# Patient Record
Sex: Female | Born: 2000
Health system: Southern US, Community
[De-identification: ages and names within clinical notes are randomized; demographics above are authoritative.]

## PROBLEM LIST (undated history)

## (undated) DIAGNOSIS — E781 Pure hyperglyceridemia: Secondary | ICD-10-CM

## (undated) DIAGNOSIS — J45909 Unspecified asthma, uncomplicated: Secondary | ICD-10-CM

## (undated) DIAGNOSIS — F419 Anxiety disorder, unspecified: Secondary | ICD-10-CM

## (undated) DIAGNOSIS — E669 Obesity, unspecified: Secondary | ICD-10-CM

## (undated) DIAGNOSIS — E282 Polycystic ovarian syndrome: Secondary | ICD-10-CM

## (undated) DIAGNOSIS — Z91018 Allergy to other foods: Secondary | ICD-10-CM

## (undated) DIAGNOSIS — F32A Depression, unspecified: Secondary | ICD-10-CM

## (undated) DIAGNOSIS — T7840XA Allergy, unspecified, initial encounter: Secondary | ICD-10-CM

## (undated) DIAGNOSIS — R51 Headache: Secondary | ICD-10-CM

## (undated) DIAGNOSIS — F0781 Postconcussional syndrome: Secondary | ICD-10-CM

## (undated) DIAGNOSIS — L732 Hidradenitis suppurativa: Secondary | ICD-10-CM

## (undated) HISTORY — DX: Unspecified asthma, uncomplicated: J45.909

## (undated) HISTORY — PX: TONSILLECTOMY AND ADENOIDECTOMY: SUR1326

## (undated) HISTORY — DX: Obesity, unspecified: E66.9

## (undated) HISTORY — DX: Headache: R51

## (undated) HISTORY — DX: Anxiety disorder, unspecified: F41.9

## (undated) HISTORY — DX: Polycystic ovarian syndrome: E28.2

## (undated) HISTORY — DX: Allergy to other foods: Z91.018

## (undated) HISTORY — DX: Pure hyperglyceridemia: E78.1

## (undated) HISTORY — DX: Depression, unspecified: F32.A

## (undated) HISTORY — DX: Allergy, unspecified, initial encounter: T78.40XA

---
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
## 2021-02-18 ENCOUNTER — Ambulatory Visit (INDEPENDENT_AMBULATORY_CARE_PROVIDER_SITE_OTHER): Payer: No Typology Code available for payment source | Admitting: Pediatric Endocrinology

## 2021-02-19 ENCOUNTER — Encounter (INDEPENDENT_AMBULATORY_CARE_PROVIDER_SITE_OTHER): Payer: Self-pay | Admitting: Pediatric Endocrinology

## 2021-02-19 ENCOUNTER — Other Ambulatory Visit: Payer: Self-pay

## 2021-02-19 ENCOUNTER — Ambulatory Visit (INDEPENDENT_AMBULATORY_CARE_PROVIDER_SITE_OTHER): Payer: No Typology Code available for payment source | Admitting: Pediatric Endocrinology

## 2021-02-19 VITALS — BP 132/88 | HR 72 | Wt 375.2 lb

## 2021-02-19 DIAGNOSIS — F5081 Binge eating disorder: Secondary | ICD-10-CM

## 2021-02-19 DIAGNOSIS — E119 Type 2 diabetes mellitus without complications: Secondary | ICD-10-CM | POA: Diagnosis not present

## 2021-02-19 DIAGNOSIS — R69 Illness, unspecified: Secondary | ICD-10-CM | POA: Diagnosis not present

## 2021-02-19 LAB — POCT GLUCOSE (DEVICE FOR HOME USE): POC Glucose: 113 mg/dl — AB (ref 70–99)

## 2021-02-19 MED ORDER — METFORMIN HCL ER 500 MG PO TB24: 1000.0000 mg | ORAL_TABLET | Freq: Two times a day (BID) | ORAL | 3 refills | Status: DC

## 2021-02-19 NOTE — Progress Notes (Signed)
Subjective:  Subjective  Patient Name: Catherine Chen Date of Birth: [birth year]  MRN: [id number]  Catherine Chen  presents to the office today for follow-up evaluation and management of her  insulin resistance, elevated fasting insulin, hypertriglyceridemia, and morbid obesity   HISTORY OF PRESENT ILLNESS:   [name] is a 21 y.o. AA female   [name] was seen alone today  1. [name] was referred to our clinic in July 2013 for the above concerns. She was born at term following a healthy pregnancy. Mom reports that she has always been large for age. They first became concerned about her weight when she was 21 yo. At that time she was not very active. They realized that she was tired because she was not sleeping well. She had her tonsils and adenoids removed at age 51. After that she became more active and mom was less concerned. Her concerns were increased again about 2 years ago when routine lab work revealed a triglyceride level over 400. They began to make some changes to diet and activity at home. Mom continues to be concerned about [name]'s weight. She worries that [name] weighs more than mom and borrows her clothes.     2. The patient's last [facility] visit was on 11/15/20. In the interim she has been doing ok.   She feels that she is stable "for the most part". She is extremely tired by her grueling schedule of work and school. 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 She is currently taking a break from working. She is living with her aunt who has returned from [place].   She is not currently taking metformin. She is "usually" eating more than once a day. She tried the low allergen protein bars that we found online last visit- but she didn't like them.   She is drinking water, some soda (diet or regular). She is no longer drinking alcohol on a regular basis.   She is going to the gym 3-4 times  a week. She stays for 2 hours and works out "to the point of exhaustion" She does mostly cardio and some weight lifting. She says that her therapist has expressed concerns that she is working out to "punish" herself and it is actually harmful. She is working to her max multiple times over while she is there.   She reports that she does not necessarily feel guilty about eating- but she eats when she feels guilty and then feels bad about how much she ate and then she goes to the gym to "repent". She is not interested in a referral to a dietician. If she were I would recommend Mirian Capuchin at [facility]. She has recently found a new counselor.   She has continued on Sprintec. She feels that it is working well for her.    3. Pertinent Review of Systems:  Constitutional: The patient feels "tired". The patient seems healthy and active. She worked last night.  Eyes: Vision seems to be good. There are no recognized eye problems. Supposed to wear glasses for reading but does not. She is seeing floaters- only when she doesn't eat - occassionally  Neck: The patient has no complaints of anterior neck swelling, soreness, tenderness, pressure, discomfort, or difficulty swallowing.   Heart: Heart rate increases with exercise or other physical activity. The patient has no complaints of palpitations, irregular heart beats, chest pain, or chest pressure.   Gastrointestinal: Bowel movents  seem normal. The patient has no complaints of excessive hunger, acid reflux, upset stomach, stomach aches or pains, diarrhea, or constipation. Some belly hunger.  Legs: Muscle mass and strength seem normal. There are no complaints of numbness, tingling, burning, or pain. No edema is noted.  Feet: There are no obvious foot problems. There are no complaints of numbness, tingling, burning, or pain. No edema is noted. Neurologic: There are no recognized problems with muscle movement and strength, sensation, or coordination. GYN/GU:  regular menses on Sprintec. 01/26/21  Skin: No rashes or issues  PAST MEDICAL, FAMILY, AND SOCIAL HISTORY  Past Medical History:  Diagnosis Date   Allergy    Phreesia 09/13/2019   Asthma    Headache(784.0)    Hydradenitis 04/22/2016   Hypertriglyceridemia    Multiple food allergies    shell fish, nuts, watermellon   Obesity    Postconcussion syndrome 02/19/2016    Family History  Problem Relation Age of Onset   Thyroid disease Father    Eczema Father    Cancer Maternal Grandmother    Hypertension Maternal Grandmother    Hyperlipidemia Maternal Grandmother    Hypertension Maternal Grandfather    Hyperlipidemia Maternal Grandfather    Cancer Paternal Grandmother    Hypertension Paternal Grandmother    Hyperlipidemia Paternal Grandmother    Asthma Paternal Grandmother    Food Allergy Paternal Grandmother    Eczema Paternal Grandmother    Obesity Mother    Obesity Sister    Hypertension Paternal Grandfather    Hyperlipidemia Paternal Grandfather    Heart disease Paternal Grandfather    Down syndrome Paternal Aunt      Current Outpatient Medications:    albuterol (PROVENTIL HFA;VENTOLIN HFA) 108 (90 Base) MCG/ACT inhaler, Inhale 2 puffs into the lungs every 4 (four) hours as needed for wheezing or shortness of breath., Disp: 1 Inhaler, Rfl: 1   cetirizine (ZYRTEC) 10 MG tablet, Take 10 mg by mouth daily., Disp: , Rfl:    SPRINTEC 28 0.25-35 MG-MCG tablet, TAKE AS DIRECTED 2 TABLETS BY MOUTH FOR 5 DAYS THEN TAKE 1 TABLET BY MOUTH ONCE DAILY, Disp: , Rfl:    diphenhydrAMINE (BENADRYL) 25 MG tablet, Take 25 mg by mouth every 6 (six) hours as needed. Taking prn (Patient not taking: Reported on 11/15/2020), Disp: , Rfl:    metFORMIN (GLUCOPHAGE-XR) 500 MG 24 hr tablet, Take 2 tablets (1,000 mg total) by mouth 2 (two) times daily with a meal., Disp: 360 tablet, Rfl: 3  Allergies as of 02/19/2021 - Review Complete 02/19/2021  Allergen Reaction Noted   Food Hives 09/08/2011    Other Anaphylaxis, Hives, Itching, Shortness Of Breath, and Swelling 09/13/2019   Peanut-containing drug products Anaphylaxis, Hives, Itching, Rash, Shortness Of Breath, and Swelling 09/13/2019   Peanuts [peanut oil] Hives 09/08/2011   Penicillins Hives 08/11/2011   Shellfish allergy Hives 09/08/2011   Coconut oil Hives 09/08/2011     reports that she has never smoked. She has been exposed to tobacco smoke. She has never used smokeless tobacco. She reports that she does not drink alcohol and does not use drugs. 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 On path to graduate Dec 2023   She is not living in dorms, she is living in an apartment.  She plays ukulele when she can.  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 Wants to do a Masters in Public affairs consultantDesign Engineering at [employer]. Planning to graduate in December.   Primary Care Provider: Pcp, No  ROS: There are no other significant problems involving Cleo's other body systems.    Objective:  Objective  Vital Signs:     BP 132/88 (BP Location: Right Arm, Patient Position: Sitting, Cuff Size: Large)    Pulse 72    Wt (!) 375 lb 3.2 oz (170.2 kg)    LMP 01/26/2021 (Approximate)    BMI 55.89 kg/m  Growth percentile SmartLinks can only be used for patients less than 20 years old.   Ht Readings from Last 3 Encounters:  05/09/20 5' 8.7" (1.745 m) (96 %, Z= 1.74)*  09/12/19 5' 8.7" (1.745 m) (96 %, Z= 1.75)*  04/21/19 5' 8.74" (1.746 m) (96 %, Z= 1.77)*   * Growth percentiles are based on CDC (Girls, 2-20 Years) data.   Wt Readings from Last 3 Encounters:  02/19/21 (!) 375 lb 3.2 oz (170.2 kg)  11/15/20 (!) 361 lb 6.4 oz (163.9 kg) (>99 %, Z= 3.11)*  08/13/20 (!) 364 lb 3.2 oz (165.2 kg) (>99 %, Z= 3.09)*   * Growth percentiles are based on CDC (Girls, 2-20  Years) data.   HC Readings from Last 3 Encounters:  No data found for [facility]   Body surface area is 2.87 meters squared. Facility age limit for growth percentiles is 20 years. Facility age limit for growth percentiles is 20 years.  PHYSICAL EXAM:   Constitutional: The patient appears healthy and well nourished. The patient's height and weight are advanced for age. Weight has increased 14 pounds. Head: The head is normocephalic. Face: The face appears normal. There are no obvious dysmorphic features. Eyes: The eyes appear to be normally formed and spaced. Gaze is conjugate. There is no obvious arcus or proptosis. Moisture appears normal. Ears: The ears are normally placed and appear externally normal. Mouth: The oropharynx and tongue appear normal. Dentition appears to be normal for age. Oral moisture is normal. Neck: The neck appears to be visibly normal. The thyroid gland is 15 grams in size. The consistency of the thyroid gland is normal. The thyroid gland is not tender to palpation. +2 acanthosis with thickening Lungs: The lungs are clear to auscultation. Air movement is good. Heart: Heart rate and rhythm are regular. Heart sounds S1 and S2 are normal. I did not appreciate any pathologic cardiac murmurs. Abdomen: The abdomen appears to be large in size for the patient's age. Bowel sounds are normal. There is no obvious hepatomegaly, splenomegaly, or other mass effect.  Arms: Muscle size and bulk are normal for age.  Hands: There is no obvious tremor. Phalangeal and metacarpophalangeal joints are normal. Palmar muscles are normal for age. Palmar skin is normal. Palmar moisture is also normal. Legs: Muscles appear normal for age. No edema is present. Feet: Feet are normally formed. Dorsalis pedal pulses are normal. Neurologic: Strength is normal for age in both the upper and lower extremities. Muscle tone is normal. Sensation to touch is normal in both the legs and feet.    LAB DATA:       Lab Results  Component Value Date   HGBA1C 5.1 11/15/2020   HGBA1C 5.5 08/13/2020   HGBA1C 5.1 05/09/2020   HGBA1C 5.6 01/02/2020   HGBA1C 5.3 09/12/2019   HGBA1C 5.3 04/21/2019   HGBA1C 5.1 11/29/2018   HGBA1C 7.9 (A) 08/18/2018  Results for orders placed or performed in visit on 02/19/21 (from the past 672 hour(s))  POCT Glucose (Device for Home Use)   Collection Time: 02/19/21  9:18 AM  Result Value Ref Range   Glucose Fasting, POC     POC Glucose 113 (A) 70 - 99 mg/dl       Assessment and Plan:  Assessment  ASSESSMENT: Necie is a 21 y.o. AA female with morbid obesity, insulin resistance, and vit d insufficieny. She has previously had hyperlipidemia with elevated triglycerides- but recent values have been WNL.  Now with concerns for type 2 diabetes with A1C 7.9% in August 2020.    1. Type 2 diabetes -A1C not checked today - She has struggled with taking her Metformin as she has had GI upset with the higher doses - insurance would not approve oral GLP-1 and she has needle phobia - She declined to do a trial injection of Trulicity in clinic today.   2. Disordered eating/exercise - Concerns about dysfunctional relationship with food and exercise - Is seeing a new therapist who is working on this - She is resistant to a referral to nutritionist today Mirian Capuchin)   3. Weight-  - she is morbidly obese - weight has increased since last visit  - Discussion as above  4. Acanthosis- persistent - improving. Related to diabetes/insulin resistance   PLAN:    1. Diagnostic: A1C at next visit. 2. Therapeutic: Restart Metformin as tolerated.  3. Patient education. Discussions as above. Now seeing a new therapist. 4. Follow-up: Return in about 3 months (around 05/19/2021).      >40 minutes spent today reviewing the medical chart, counseling the patient/family, and documenting today's encounter.    Dessa Phi, MD

## 2021-02-19 NOTE — Patient Instructions (Addendum)
°  [email or web address]  [email or web address] Mirian Capuchin    Work on [employer] and limits for when you are at the gym. There is no advantage to working out to the point of exhaustion. Set reasonable goals where you are comfortable and then increase SLOWLY from there. Consistency and repetition are more important than maxing out.

## 2021-04-23 ENCOUNTER — Other Ambulatory Visit: Payer: Self-pay

## 2021-04-23 ENCOUNTER — Encounter (HOSPITAL_BASED_OUTPATIENT_CLINIC_OR_DEPARTMENT_OTHER): Payer: Self-pay

## 2021-04-23 DIAGNOSIS — H53149 Visual discomfort, unspecified: Secondary | ICD-10-CM | POA: Insufficient documentation

## 2021-04-23 DIAGNOSIS — R519 Headache, unspecified: Secondary | ICD-10-CM | POA: Diagnosis not present

## 2021-04-23 DIAGNOSIS — Z91010 Allergy to peanuts: Secondary | ICD-10-CM | POA: Diagnosis not present

## 2021-04-23 DIAGNOSIS — Z7722 Contact with and (suspected) exposure to environmental tobacco smoke (acute) (chronic): Secondary | ICD-10-CM | POA: Insufficient documentation

## 2021-04-23 DIAGNOSIS — J45909 Unspecified asthma, uncomplicated: Secondary | ICD-10-CM | POA: Insufficient documentation
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
## 2021-04-24 ENCOUNTER — Emergency Department (HOSPITAL_BASED_OUTPATIENT_CLINIC_OR_DEPARTMENT_OTHER)
Admission: EM | Admit: 2021-04-24 | Discharge: 2021-04-24 | Disposition: A | Discharge status: Home / Self Care | Payer: No Typology Code available for payment source | Attending: Emergency Medicine | Admitting: Emergency Medicine

## 2021-04-24 DIAGNOSIS — G44319 Acute post-traumatic headache, not intractable: Secondary | ICD-10-CM | POA: Diagnosis not present

## 2021-04-24 DIAGNOSIS — R519 Headache, unspecified: Secondary | ICD-10-CM

## 2021-04-24 LAB — PREGNANCY, URINE: Preg Test, Ur: NEGATIVE

## 2021-04-24 MED ORDER — ACETAMINOPHEN 500 MG PO TABS
1000.0000 mg | ORAL_TABLET | Freq: Once | ORAL | Status: AC
  Administered 2021-04-24: 1000 mg via ORAL
  Filled 2021-04-24: qty 2

## 2021-04-24 MED ORDER — SODIUM CHLORIDE 0.9 % IV BOLUS: 1000.0000 mL | Freq: Once | INTRAVENOUS | Status: DC

## 2021-04-24 MED ORDER — KETOROLAC TROMETHAMINE 30 MG/ML IJ SOLN
30.0000 mg | Freq: Once | INTRAMUSCULAR | Status: AC
  Administered 2021-04-24: 30 mg via INTRAMUSCULAR
  Filled 2021-04-24: qty 1

## 2021-04-24 MED ORDER — METOCLOPRAMIDE HCL 5 MG/ML IJ SOLN
10.0000 mg | Freq: Once | INTRAMUSCULAR | Status: DC
  Filled 2021-04-24: qty 2

## 2021-04-24 MED ORDER — KETOROLAC TROMETHAMINE 15 MG/ML IJ SOLN
15.0000 mg | Freq: Once | INTRAMUSCULAR | Status: DC | PRN
  Filled 2021-04-24: qty 1

## 2021-04-24 MED ORDER — DIPHENHYDRAMINE HCL 50 MG/ML IJ SOLN
25.0000 mg | Freq: Once | INTRAMUSCULAR | Status: DC
  Filled 2021-04-24: qty 1
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
## 2021-04-25 DIAGNOSIS — G44309 Post-traumatic headache, unspecified, not intractable: Secondary | ICD-10-CM | POA: Diagnosis not present

## 2021-05-11 DIAGNOSIS — M25512 Pain in left shoulder: Secondary | ICD-10-CM | POA: Diagnosis not present

## 2021-05-27 ENCOUNTER — Ambulatory Visit (INDEPENDENT_AMBULATORY_CARE_PROVIDER_SITE_OTHER): Payer: No Typology Code available for payment source | Admitting: Pediatric Endocrinology

## 2021-06-03 DIAGNOSIS — F411 Generalized anxiety disorder: Secondary | ICD-10-CM | POA: Diagnosis not present

## 2021-06-03 DIAGNOSIS — F39 Unspecified mood [affective] disorder: Secondary | ICD-10-CM | POA: Diagnosis not present

## 2021-06-03 DIAGNOSIS — R69 Illness, unspecified: Secondary | ICD-10-CM | POA: Diagnosis not present

## 2021-06-28 DIAGNOSIS — F411 Generalized anxiety disorder: Secondary | ICD-10-CM | POA: Diagnosis not present

## 2021-06-28 DIAGNOSIS — R69 Illness, unspecified: Secondary | ICD-10-CM | POA: Diagnosis not present

## 2021-06-28 DIAGNOSIS — F39 Unspecified mood [affective] disorder: Secondary | ICD-10-CM | POA: Diagnosis not present

## 2021-07-25 ENCOUNTER — Encounter (HOSPITAL_COMMUNITY): Payer: Self-pay

## 2021-07-25 ENCOUNTER — Emergency Department (HOSPITAL_COMMUNITY)
Admission: EM | Admit: 2021-07-25 | Discharge: 2021-07-25 | Disposition: A | Discharge status: Home / Self Care | Payer: No Typology Code available for payment source | Attending: Emergency Medicine | Admitting: Emergency Medicine

## 2021-07-25 ENCOUNTER — Other Ambulatory Visit: Payer: Self-pay

## 2021-07-25 DIAGNOSIS — R519 Headache, unspecified: Secondary | ICD-10-CM | POA: Diagnosis present

## 2021-07-25 DIAGNOSIS — I10 Essential (primary) hypertension: Secondary | ICD-10-CM | POA: Diagnosis not present

## 2021-07-25 DIAGNOSIS — J45909 Unspecified asthma, uncomplicated: Secondary | ICD-10-CM | POA: Diagnosis not present

## 2021-07-25 DIAGNOSIS — Z91010 Allergy to peanuts: Secondary | ICD-10-CM | POA: Diagnosis not present

## 2021-07-25 NOTE — ED Provider Notes (Signed)
[facility] EMERGENCY DEPARTMENT Provider Note   CSN: [id number] Arrival date & time: 07/25/21  1624     History  Chief Complaint  Patient presents with   Hypertension    Catherine Chen is a 21 y.o. female with medical history significant for asthma, hypertriglyceridemia who presents to the emergency department for an episode of elevated blood pressure.  Patient states that she was at summer camp and suddenly began to feel very dizzy with floaters in her vision and a headache.  They checked her blood pressure and it was 200/90.  EMS was called, and patient states that about 20 minutes later as she was being loaded onto the ambulance, her symptoms resolved.  She states that she is completely asymptomatic at this time.  She has no previous history of high blood pressure.  She is requesting rapid discharged and returned back to camp.  She denies chest pain, shortness of breath, abdominal pain, nausea, vomiting.  She currently denies headache, vision changes or tinnitus.   Hypertension Associated symptoms include headaches. Pertinent negatives include no chest pain.       Home Medications Prior to Admission medications   Medication Sig Start Date End Date Taking? Authorizing Provider  albuterol (PROVENTIL HFA;VENTOLIN HFA) 108 (90 Base) MCG/ACT inhaler Inhale 2 puffs into the lungs every 4 (four) hours as needed for wheezing or shortness of breath. 04/21/16   [name], MD  cetirizine (ZYRTEC) 10 MG tablet Take 10 mg by mouth daily.    [provider]  metFORMIN (GLUCOPHAGE-XR) 500 MG 24 hr tablet Take 2 tablets (1,000 mg total) by mouth 2 (two) times daily with a meal. 02/19/21   Dessa Phi, MD  SPRINTEC 28 0.25-35 MG-MCG tablet TAKE AS DIRECTED 2 TABLETS BY MOUTH FOR 5 DAYS THEN TAKE 1 TABLET BY MOUTH ONCE DAILY 10/13/18   [provider]      Allergies    Food, Other, Peanut-containing drug products, Peanuts [peanut oil], Penicillins, Shellfish allergy, and  Coconut (cocos nucifera)    Review of Systems   Review of Systems  Eyes:  Positive for visual disturbance.  Cardiovascular:  Negative for chest pain.  Neurological:  Positive for headaches.    Physical Exam Updated Vital Signs BP 135/76 (BP Location: Right Arm)   Pulse 75   Temp 98.5 F (36.9 C) (Oral)   Resp 20   Ht 5\' 8"  (1.727 m)   Wt (!) 158.8 kg   LMP 07/15/2021   SpO2 99%   BMI 53.22 kg/m  Physical Exam Vitals and nursing note reviewed.  Constitutional:      General: She is not in acute distress.    Appearance: She is not ill-appearing.  HENT:     Head: Atraumatic.  Eyes:     Conjunctiva/sclera: Conjunctivae normal.  Cardiovascular:     Rate and Rhythm: Normal rate and regular rhythm.     Pulses: Normal pulses.     Heart sounds: No murmur heard. Pulmonary:     Effort: Pulmonary effort is normal. No respiratory distress.     Breath sounds: Normal breath sounds.  Abdominal:     General: Abdomen is flat. There is no distension.     Palpations: Abdomen is soft.     Tenderness: There is no abdominal tenderness.  Musculoskeletal:        General: Normal range of motion.     Cervical back: Normal range of motion.  Skin:    General: Skin is warm and dry.  Capillary Refill: Capillary refill takes less than 2 seconds.  Neurological:     General: No focal deficit present.     Mental Status: She is alert.  Psychiatric:        Mood and Affect: Mood normal.     ED Results / Procedures / Treatments   Labs (all labs ordered are listed, but only abnormal results are displayed) Labs Reviewed - No data to display  EKG None  Radiology No results found.  Procedures Procedures    Medications Ordered in ED Medications - No data to display  ED Course/ Medical Decision Making/ A&P                           Medical Decision Making  21 year old female presents to the ED for evaluation of symptomatic elevated blood pressure that occurred prior to arrival.   Currently, patient is completely asymptomatic.  Her blood pressure is noted to be 135/76.  Physical exam very reassuring.  She denies all associated symptoms and is requesting discharge.  She does not have a primary care physician, although she states that she does have insurance and will set up an appointment for further evaluation.  We discussed strict return precautions and when to return back to the hospital.  She expresses understanding is amenable to plan.  Discharged home in stable condition.  Final Clinical Impression(s) / ED Diagnoses Final diagnoses:  Hypertension, unspecified type    Rx / DC Orders ED Discharge Orders     None         Delight Ovens 07/25/21 2321    Pricilla Loveless, MD 07/29/21 [phone]

## 2021-07-25 NOTE — Discharge Instructions (Signed)
Your exam today was overall reassuring, since your episode of high blood pressure had resolved by the time that you got here, there is no other intervention required from [place] at this time.  You can follow-up with a primary care physician to have this reevaluated.  I have given you a referral to the [facility] health and wellness center if you are unable to find a PCP.  If you develop symptoms again that do not resolve after 20 minutes and you develop symptoms of chest pain, blurred vision or severe headache, feel free to return to the emergency department for reevaluation.

## 2021-07-25 NOTE — ED Triage Notes (Signed)
Pt presents to ED, states she was at a summer camp and had BP 200/90, states she was seeing floaters at the time. Per EMS, BP had come down to 140's/90's. Pt states she is feeling better at this time, just tired.

## 2021-07-29 DIAGNOSIS — F39 Unspecified mood [affective] disorder: Secondary | ICD-10-CM | POA: Diagnosis not present

## 2021-07-29 DIAGNOSIS — R69 Illness, unspecified: Secondary | ICD-10-CM | POA: Diagnosis not present

## 2021-07-29 DIAGNOSIS — F331 Major depressive disorder, recurrent, moderate: Secondary | ICD-10-CM | POA: Diagnosis not present

## 2021-07-30 DIAGNOSIS — J4520 Mild intermittent asthma, uncomplicated: Secondary | ICD-10-CM | POA: Diagnosis not present

## 2021-07-30 DIAGNOSIS — J060 Acute laryngopharyngitis: Secondary | ICD-10-CM | POA: Diagnosis not present

## 2021-07-30 DIAGNOSIS — R030 Elevated blood-pressure reading, without diagnosis of hypertension: Secondary | ICD-10-CM | POA: Diagnosis not present

## 2021-09-30 DIAGNOSIS — F331 Major depressive disorder, recurrent, moderate: Secondary | ICD-10-CM | POA: Diagnosis not present

## 2021-09-30 DIAGNOSIS — R69 Illness, unspecified: Secondary | ICD-10-CM | POA: Diagnosis not present

## 2021-09-30 DIAGNOSIS — F411 Generalized anxiety disorder: Secondary | ICD-10-CM | POA: Diagnosis not present

## 2021-11-20 DIAGNOSIS — J020 Streptococcal pharyngitis: Secondary | ICD-10-CM | POA: Diagnosis not present

## 2021-11-20 DIAGNOSIS — Z113 Encounter for screening for infections with a predominantly sexual mode of transmission: Secondary | ICD-10-CM | POA: Diagnosis not present

## 2021-11-20 DIAGNOSIS — R69 Illness, unspecified: Secondary | ICD-10-CM | POA: Diagnosis not present

## 2021-11-29 DIAGNOSIS — F411 Generalized anxiety disorder: Secondary | ICD-10-CM | POA: Diagnosis not present

## 2021-11-29 DIAGNOSIS — R69 Illness, unspecified: Secondary | ICD-10-CM | POA: Diagnosis not present

## 2021-12-25 DIAGNOSIS — R69 Illness, unspecified: Secondary | ICD-10-CM | POA: Diagnosis not present

## 2021-12-25 DIAGNOSIS — F411 Generalized anxiety disorder: Secondary | ICD-10-CM | POA: Diagnosis not present

## 2022-07-18 ENCOUNTER — Encounter (INDEPENDENT_AMBULATORY_CARE_PROVIDER_SITE_OTHER): Payer: Self-pay

## 2022-12-14 DIAGNOSIS — T148XXA Other injury of unspecified body region, initial encounter: Secondary | ICD-10-CM

## 2022-12-14 HISTORY — DX: Other injury of unspecified body region, initial encounter: T14.8XXA

## 2023-05-25 ENCOUNTER — Ambulatory Visit: Admitting: Obstetrics and Gynecology

## 2023-05-25 ENCOUNTER — Encounter: Payer: Self-pay | Admitting: Obstetrics and Gynecology

## 2023-05-25 ENCOUNTER — Other Ambulatory Visit (HOSPITAL_COMMUNITY)
Admission: RE | Admit: 2023-05-25 | Discharge: 2023-05-25 | Disposition: A | Discharge status: Home / Self Care | Source: Ambulatory Visit | Attending: Obstetrics and Gynecology | Admitting: Obstetrics and Gynecology

## 2023-05-25 ENCOUNTER — Other Ambulatory Visit: Payer: Self-pay

## 2023-05-25 VITALS — BP 137/91 | HR 83 | Ht 69.0 in | Wt 360.8 lb

## 2023-05-25 DIAGNOSIS — Z113 Encounter for screening for infections with a predominantly sexual mode of transmission: Secondary | ICD-10-CM | POA: Insufficient documentation

## 2023-05-25 DIAGNOSIS — Z01419 Encounter for gynecological examination (general) (routine) without abnormal findings: Secondary | ICD-10-CM | POA: Diagnosis not present

## 2023-05-25 DIAGNOSIS — E282 Polycystic ovarian syndrome: Secondary | ICD-10-CM | POA: Diagnosis not present

## 2023-05-25 DIAGNOSIS — Z1331 Encounter for screening for depression: Secondary | ICD-10-CM | POA: Diagnosis not present

## 2023-05-25 DIAGNOSIS — N914 Secondary oligomenorrhea: Secondary | ICD-10-CM

## 2023-05-25 DIAGNOSIS — Z3202 Encounter for pregnancy test, result negative: Secondary | ICD-10-CM | POA: Diagnosis not present

## 2023-05-25 LAB — POCT URINALYSIS DIP (DEVICE)
Bilirubin Urine: NEGATIVE
Glucose, UA: NEGATIVE mg/dL
Ketones, ur: NEGATIVE mg/dL
Nitrite: NEGATIVE
Protein, ur: NEGATIVE mg/dL
Specific Gravity, Urine: 1.025 (ref 1.005–1.030)
Urobilinogen, UA: 0.2 mg/dL (ref 0.0–1.0)
pH: 6.5 (ref 5.0–8.0)

## 2023-05-25 LAB — POCT PREGNANCY, URINE: Preg Test, Ur: NEGATIVE

## 2023-05-25 NOTE — Progress Notes (Signed)
 GYNECOLOGY VISIT Patient name: Catherine Chen MRN [id number]  Date of birth: [birth year] Chief Complaint:   Menstrual Problem  History of Present Illness:   Catherine Chen is a 23 y.o. No obstetric history on file. being seen today for a routine annual exam.  Current complaints: contraception & pap   Discussed the use of AI scribe software for clinical note transcription with the patient, who gave verbal consent to proceed.  History of Present Illness Catherine Chen is a 23 year old female with a history of irregular menstrual cycles and PCOS who presents for a Pap smear and STD testing.  She had a Pap smear in August 2023 at [facility] but no longer has access to those records and is unsure if it was abnormal.  She is currently sexually active with a female partner and has no pain with intercourse.  She experiences irregular menstrual cycles, with her first period in three years occurring two months ago, lasting about two weeks. Previously, she used birth control until 2022 to manage heavy periods but discontinued it for weight loss reasons and because she was not sexually active at the time.  She has a history of polycystic ovary syndrome (PCOS), contributing to her irregular cycles. Her last period involved heavy cramping and lasted about three weeks. She is not currently trying to conceive. Diagnosed by prior OBGYN office based on history of irregular menses, difficulty with weight control, facial hair and some lab work. Blood work demonstrated insulin resistance at that time.    Patient's last menstrual period was 04/13/2023 (exact date).   The pregnancy intention screening data noted above was reviewed. Potential methods of contraception were discussed. The patient elected to proceed with No data recorded.   Last pap No results found for: "DIAGPAP", "HPVHIGH", "ADEQPAP" Last mammogram: n/a.  Last colonoscopy: n/a.      02/29/2020   10:23 AM  Depression screen PHQ 2/9  Decreased  Interest 1  Down, Depressed, Hopeless 1  PHQ - 2 Score 2  Altered sleeping 0  Tired, decreased energy 1  Change in appetite 3  Feeling bad or failure about yourself  2  Trouble concentrating 1  Moving slowly or fidgety/restless 0  Suicidal thoughts 0  PHQ-9 Score 9  Difficult doing work/chores Somewhat difficult        02/29/2020   10:25 AM  GAD 7 : Generalized Anxiety Score  Nervous, Anxious, on Edge 2  Control/stop worrying 3  Worry too much - different things 3  Trouble relaxing 2  Restless 0  Easily annoyed or irritable 1  Afraid - awful might happen 1  Total GAD 7 Score 12  Anxiety Difficulty Not difficult at all     Review of Systems:   Pertinent items are noted in HPI Denies any headaches, blurred vision, fatigue, shortness of breath, chest pain, abdominal pain, abnormal vaginal discharge/itching/odor/irritation, problems with periods, bowel movements, urination, or intercourse unless otherwise stated above. Pertinent History Reviewed:  Reviewed past medical,surgical, social and family history.  Reviewed problem list, medications and allergies. Physical Assessment:   Vitals:   05/25/23 1551 05/25/23 1607  BP: (!) 139/90 (!) 137/91  Pulse: (!) 103 83  Weight: (!) 360 lb 12.8 oz (163.7 kg)   Height: 5\' 9"  (1.753 m)   There is no height or weight on file to calculate BMI.        Physical Examination:   General appearance - well appearing, and in no distress  Mental status - alert, oriented to  person, place, and time  Psych:  She has a normal mood and affect  Skin - normal color, no suspicious lesions noted  Chest - effort normal  Pelvic - deferred  Chaperone present for exam  No results found for this or any previous visit (from the past 24 hours).    Assessment & Plan:  Assessment and Plan Assessment & Plan Polycystic Ovary Syndrome (PCOS) PCOS with irregular cycles and prolonged amenorrhea, risk of endometrial hyperplasia due to infrequent  menstruation. Discussed non-oral contraceptive options due to potential impact of Wegovy on oral contraceptives. - Discussed menstrual regulation options including hormonal IUDs (Skyla, Kyleena, Mirena, Liletta) and cyclic Provera. She expressed interest in the Rake IUD. - Schedule follow-up for Skyla IUD insertion.  Irregular Menstrual Cycle Irregular cycles due to PCOS, last menstruation two months ago after a three-year absence. Regular cycles needed to prevent endometrial hyperplasia. - Use Skyla IUD for endometrial protection  Sexually Transmitted Infection (STI) Screening Requested STI testing, sexually active with a female partner. - Perform blood tests for HIV, syphilis, and hepatitis. - Perform self-swab for gonorrhea and chlamydia.   Well woman exam with routine gynecological exam (Primary) - Cervical cancer screening: Discussed screening Q3 years. Reviewed importance of annual exams and limits of pap smear. Pap with reflex HPV to be done with IUD insertion visit - GC/CT: Discussed and recommended. Pt  accepts - Birth Control: return for IUD insertion - Breast Health: Encouraged self breast awareness/exams.  - Follow-up: 12 months and prn  Orders Placed This Encounter  Procedures   RPR+HBsAg+HCVAb+...   POCT urinalysis dip (device)   Pregnancy, urine POC    Meds: No orders of the defined types were placed in this encounter.   Follow-up: No follow-ups on file.  Kiki Pelton, MD 05/25/2023 2:02 PM

## 2023-05-27 ENCOUNTER — Ambulatory Visit: Payer: Self-pay | Admitting: Obstetrics and Gynecology

## 2023-05-27 ENCOUNTER — Other Ambulatory Visit: Payer: Self-pay | Admitting: Obstetrics and Gynecology

## 2023-05-27 DIAGNOSIS — N760 Acute vaginitis: Secondary | ICD-10-CM

## 2023-05-27 DIAGNOSIS — B3731 Acute candidiasis of vulva and vagina: Secondary | ICD-10-CM

## 2023-05-27 LAB — CERVICOVAGINAL ANCILLARY ONLY
Bacterial Vaginitis (gardnerella): POSITIVE — AB
Candida Glabrata: NEGATIVE
Candida Vaginitis: POSITIVE — AB
Chlamydia: NEGATIVE
Comment: NEGATIVE
Comment: NEGATIVE
Comment: NEGATIVE
Comment: NEGATIVE
Comment: NEGATIVE
Comment: NORMAL
Neisseria Gonorrhea: NEGATIVE
Trichomonas: NEGATIVE

## 2023-05-27 LAB — RPR+HBSAG+HCVAB+...
HIV Screen 4th Generation wRfx: NONREACTIVE
Hep C Virus Ab: NONREACTIVE
Hepatitis B Surface Ag: NEGATIVE
RPR Ser Ql: NONREACTIVE

## 2023-05-27 MED ORDER — FLUCONAZOLE 150 MG PO TABS: 150.0000 mg | ORAL_TABLET | Freq: Once | ORAL | 1 refills | Status: AC

## 2023-05-27 MED ORDER — METRONIDAZOLE 500 MG PO TABS: 500.0000 mg | ORAL_TABLET | Freq: Two times a day (BID) | ORAL | 0 refills | Status: AC

## 2023-07-06 ENCOUNTER — Other Ambulatory Visit: Payer: Self-pay

## 2023-07-06 ENCOUNTER — Encounter: Payer: Self-pay | Admitting: Obstetrics and Gynecology

## 2023-07-06 ENCOUNTER — Ambulatory Visit: Admitting: Obstetrics and Gynecology

## 2023-07-06 ENCOUNTER — Other Ambulatory Visit (HOSPITAL_COMMUNITY)
Admission: RE | Admit: 2023-07-06 | Discharge: 2023-07-06 | Disposition: A | Discharge status: Home / Self Care | Source: Ambulatory Visit | Attending: Obstetrics and Gynecology | Admitting: Obstetrics and Gynecology

## 2023-07-06 VITALS — BP 146/91 | HR 89 | Wt 356.8 lb

## 2023-07-06 DIAGNOSIS — Z124 Encounter for screening for malignant neoplasm of cervix: Secondary | ICD-10-CM | POA: Diagnosis not present

## 2023-07-06 DIAGNOSIS — Z01419 Encounter for gynecological examination (general) (routine) without abnormal findings: Secondary | ICD-10-CM | POA: Diagnosis present

## 2023-07-06 DIAGNOSIS — Z30430 Encounter for insertion of intrauterine contraceptive device: Secondary | ICD-10-CM | POA: Diagnosis not present

## 2023-07-06 DIAGNOSIS — Z975 Presence of (intrauterine) contraceptive device: Secondary | ICD-10-CM | POA: Insufficient documentation

## 2023-07-06 MED ORDER — IBUPROFEN 800 MG PO TABS
800.0000 mg | ORAL_TABLET | Freq: Once | ORAL | Status: AC
  Administered 2023-07-06: 800 mg via ORAL

## 2023-07-06 MED ORDER — LEVONORGESTREL 13.5 MG IU IUD
1.0000 | INTRAUTERINE_SYSTEM | Freq: Once | INTRAUTERINE | Status: AC
Start: 2023-07-06 — End: 2023-07-06
  Administered 2023-07-06: 13.5 mg via INTRAUTERINE

## 2023-07-06 NOTE — Progress Notes (Signed)
 GYNECOLOGY VISIT  Patient name: Catherine Chen MRN [id number]  Date of birth: 28-Aug-2000 Chief Complaint:   No chief complaint on file.   History:  Catherine Chen is a 23 y.o. G0P0000 being seen today for IUD insertion.    Past Medical History:  Diagnosis Date   Allergy    Phreesia 09/13/2019   Anxiety    Asthma    Bone fracture 12/2022   Depression    Fever 12/06/2010   Headache(784.0)    Hydradenitis 04/22/2016   Hypertriglyceridemia    Multiple food allergies    shell fish, nuts, watermellon   Obesity    PCOS (polycystic ovarian syndrome)    Pharyngitis 12/06/2010   Postconcussion syndrome 02/19/2016    Past Surgical History:  Procedure Laterality Date   TONSILLECTOMY AND ADENOIDECTOMY  2008 or 2009    The following portions of the patient's history were reviewed and updated as appropriate: allergies, current medications, past family history, past medical history, past social history, past surgical history and problem list.   Health Maintenance:   Last pap No results found for: DIAGPAP, HPVHIGH, ADEQPAP  High Risk HPV: Positive  Adequacy:  Satisfactory for evaluation, transformation zone component PRESENT  Diagnosis:  Atypical squamous cells of undetermined significance (ASC-US )  Last mammogram: N/A   Review of Systems:  Pertinent items are noted in HPI. Comprehensive review of systems was otherwise negative.   Objective:  Physical Exam BP (!) 146/91   Pulse 89   LMP 03/14/2023 (Approximate)    Physical Exam Vitals and nursing note reviewed. Exam conducted with a chaperone present.  Constitutional:      Appearance: Normal appearance.  HENT:     Head: Normocephalic and atraumatic.  Pulmonary:     Effort: Pulmonary effort is normal.  Genitourinary:    General: Normal vulva.     Vagina: Normal.     Cervix: Normal.  Skin:    General: Skin is warm and dry.  Neurological:     General: No focal deficit present.     Mental Status: She is  alert.  Psychiatric:        Mood and Affect: Mood normal.        Behavior: Behavior normal.        Thought Content: Thought content normal.        Judgment: Judgment normal.     IUD Insertion Procedure Note Patient identified, informed consent performed, consent signed.   Discussed risks of irregular bleeding, cramping, infection, malpositioning or misplacement of the IUD outside the uterus which may require further procedure such as laparoscopy. Also discussed >99% contraception efficacy, increased risk of ectopic pregnancy with failure of method.  Time out was performed.  Urine pregnancy test negative.  Speculum placed in the vagina.  Cervix visualized.  Cleaned with Betadine x 2.  Anterior cervix grasped with a single tooth tenaculum.  Paracervical block was not administered.  Skyla  IUD placed per manufacturer's recommendations.  Strings trimmed to 3 cm. Tenaculum was removed, good hemostasis noted.  Patient tolerated procedure well.   Patient was given post-procedure instructions.  She was advised to have backup contraception for one week.  Patient was also asked to check IUD strings periodically and follow up prn for IUD check.       Assessment & Plan:  1. Encounter for IUD insertion (Primary) 2. IUD (intrauterine device) in place Now s/p uncomplicated Skyla  IUD insertion. Given post-procedure NSAID for pain control. - Levonorgestrel  IUD 13.5 mg - ibuprofen  (ADVIL ) tablet  800 mg  3. Well woman exam with routine gynecological exam Pap collected - Cytology - PAP( [place])   Routine preventative health maintenance measures emphasized.  Catherine Quarry, MD Minimally Invasive Gynecologic Surgery Center for [facility], [facility]

## 2023-07-08 LAB — CYTOLOGY - PAP: Diagnosis: NEGATIVE

## 2023-07-09 ENCOUNTER — Ambulatory Visit: Payer: Self-pay | Admitting: Obstetrics and Gynecology

## 2023-07-23 ENCOUNTER — Encounter: Payer: Self-pay | Admitting: Obstetrics and Gynecology

## 2023-07-23 DIAGNOSIS — N921 Excessive and frequent menstruation with irregular cycle: Secondary | ICD-10-CM

## 2023-07-28 ENCOUNTER — Ambulatory Visit: Admitting: Clinical

## 2023-07-28 DIAGNOSIS — F39 Unspecified mood [affective] disorder: Secondary | ICD-10-CM

## 2023-07-28 MED ORDER — TRANEXAMIC ACID 650 MG PO TABS: 1300.0000 mg | ORAL_TABLET | Freq: Three times a day (TID) | ORAL | 2 refills | Status: DC

## 2023-07-28 NOTE — BH Specialist Note (Signed)
 Integrated Behavioral Health via Telemedicine Visit  07/28/2023 Catherine Chen [id number]  Number of Integrated Behavioral Health Clinician visits: 1- Initial Visit  Session Start time: [id number]   Session End time: 0912  Total time in minutes: 55    Referring Provider: Carter Quarry, MD Catherine/Family location: Home [facility] Provider location: Center for [facility]  All persons participating in visit: Catherine Chen and Catherine Chen   Types of Service: Individual psychotherapy and Video visit  I connected with Catherine Chen n/a via  Telephone or Video Enabled Telemedicine Application  (Video is Caregility application) and verified that I am speaking with the correct person using two identifiers. Discussed confidentiality: Yes   I discussed the limitations of telemedicine and the availability of in person appointments.  Discussed there is a possibility of technology failure and discussed alternative modes of communication if that failure occurs.  I discussed that engaging in this telemedicine visit, they consent to the provision of behavioral healthcare and the services will be billed under their insurance.  Catherine and/or legal guardian expressed understanding and consented to Telemedicine visit: Yes   Presenting Concerns: Catherine and/or family reports the following symptoms/concerns: Depression with passive SI (no intent and no plan),poor sleep, anxiety, life stress (working full time, started non-profit, starting two part-time jobs, starting [employer] program in the fall; relationship stressors, etc.). Pt has tried both Zoloft and Latuda (neither helpful); currently taking Hydroxyzine for anxiety with panic attacks; open to implementing additional self-coping strategy to help manage stress reaction.  Duration of problem: Ongoing; Severity of problem: severe  Catherine and/or Family's Strengths/Protective  Factors: Concrete supports in place (healthy food, safe environments, etc.) and Sense of purpose  Goals Addressed: Catherine will:  Reduce symptoms of: anxiety, depression, insomnia, and stress   Increase knowledge and/or ability of: self-management skills   Demonstrate ability to: Increase healthy adjustment to current life circumstances and Increase motivation to adhere to plan of care  Progress towards Goals: Ongoing    Interventions: Interventions utilized:  Mindfulness or Management consultant, Psychoeducation and/or Health Education, Link to [employer], and Safety Standardized Assessments completed: Not Needed    Catherine and/or Family Response: Catherine agrees with treatment plan.   Clinical Assessment/Diagnosis  Mood disorder [facility]), unspecified  Catherine may benefit from psychoeducation and brief therapeutic interventions regarding coping with symptoms of depression, anxiety, insomnia, life stress .  Plan: Follow up with behavioral health clinician on : Two weeks Behavioral recommendations:  -Continue taking hydroxyzine as prescribed(consider taking before bed at least once to see if this helps improve sleep) -CALM relaxation breathing exercise twice daily (morning; at bedtime with sleep sounds); as needed throughout the day. -Consider SCORE (www.score.org) as additional resource for managing current non-profit and/or future endeavors Referral(s): Integrated Art gallery manager (In Clinic) and Community Resources:  SCORE  I discussed the assessment and treatment plan with the Catherine and/or parent/guardian. They were provided an opportunity to ask questions and all were answered. They agreed with the plan and demonstrated an understanding of the instructions.   They were advised to call back or seek an in-person evaluation if the symptoms worsen or if the condition fails to improve as anticipated.  Warren BROCKS Lai Hendriks, LCSW     07/06/2023    4:45 PM  05/26/2023   12:01 PM 05/25/2023    3:56 PM 02/29/2020   10:23 AM  Depression screen PHQ 2/9  Decreased Interest 3 2 2 1   Down, Depressed, Hopeless  2 2 2 1   PHQ - 2 Score 5 4 4 2   Altered sleeping 2 2 2  0  Tired, decreased energy 2 2 2 1   Change in appetite 3 2 2 3   Feeling bad or failure about yourself  2 1 1 2   Trouble concentrating 1 0 0 1  Moving slowly or fidgety/restless 0 0 0 0  Suicidal thoughts 1 1 1  0  PHQ-9 Score 16 12 12 9   Difficult doing work/chores    Somewhat difficult      07/06/2023    4:46 PM 05/26/2023   12:02 PM 05/25/2023    3:57 PM 02/29/2020   10:25 AM  GAD 7 : Generalized Anxiety Score  Nervous, Anxious, on Edge 3 3 3 2   Control/stop worrying 3 3 3 3   Worry too much - different things 3 3 3 3   Trouble relaxing 3 3 3 2   Restless 3 3 3  0  Easily annoyed or irritable 3 3 3 1   Afraid - awful might happen 2 2 2 1   Total GAD 7 Score 20 20 20 12   Anxiety Difficulty    Not difficult at all

## 2023-07-28 NOTE — Patient Instructions (Addendum)
[facility] [address] [place], [place] [address] [phone] (main office) [phone] (Thuy Atilano's office)   SCORE: For the life of your business www.score.org   [facility] [email or web address].org   

## 2023-08-03 ENCOUNTER — Emergency Department (HOSPITAL_BASED_OUTPATIENT_CLINIC_OR_DEPARTMENT_OTHER)
Admission: EM | Admit: 2023-08-03 | Discharge: 2023-08-03 | Disposition: A | Discharge status: Home / Self Care | Source: Ambulatory Visit | Attending: Emergency Medicine | Admitting: Emergency Medicine

## 2023-08-03 ENCOUNTER — Other Ambulatory Visit: Payer: Self-pay

## 2023-08-03 ENCOUNTER — Encounter (HOSPITAL_BASED_OUTPATIENT_CLINIC_OR_DEPARTMENT_OTHER): Payer: Self-pay

## 2023-08-03 DIAGNOSIS — E119 Type 2 diabetes mellitus without complications: Secondary | ICD-10-CM | POA: Insufficient documentation

## 2023-08-03 DIAGNOSIS — Z30431 Encounter for routine checking of intrauterine contraceptive device: Secondary | ICD-10-CM | POA: Insufficient documentation

## 2023-08-03 DIAGNOSIS — N939 Abnormal uterine and vaginal bleeding, unspecified: Secondary | ICD-10-CM | POA: Insufficient documentation

## 2023-08-03 DIAGNOSIS — N921 Excessive and frequent menstruation with irregular cycle: Secondary | ICD-10-CM

## 2023-08-03 DIAGNOSIS — Z91010 Allergy to peanuts: Secondary | ICD-10-CM | POA: Insufficient documentation

## 2023-08-03 DIAGNOSIS — Z7984 Long term (current) use of oral hypoglycemic drugs: Secondary | ICD-10-CM | POA: Diagnosis not present

## 2023-08-03 LAB — CBC
HCT: 39.8 % (ref 36.0–46.0)
Hemoglobin: 12.7 g/dL (ref 12.0–15.0)
MCH: 27.1 pg (ref 26.0–34.0)
MCHC: 31.9 g/dL (ref 30.0–36.0)
MCV: 85 fL (ref 80.0–100.0)
Platelets: 283 K/uL (ref 150–400)
RBC: 4.68 MIL/uL (ref 3.87–5.11)
RDW: 12.5 % (ref 11.5–15.5)
WBC: 10.3 K/uL (ref 4.0–10.5)
nRBC: 0 % (ref 0.0–0.2)

## 2023-08-03 LAB — PREGNANCY, URINE: Preg Test, Ur: NEGATIVE

## 2023-08-03 MED ORDER — TRANEXAMIC ACID 650 MG PO TABS: 1300.0000 mg | ORAL_TABLET | Freq: Three times a day (TID) | ORAL | 2 refills | Status: AC

## 2023-08-03 MED ORDER — ACETAMINOPHEN 500 MG PO TABS
1000.0000 mg | ORAL_TABLET | Freq: Once | ORAL | Status: AC
  Administered 2023-08-03: 1000 mg via ORAL
  Filled 2023-08-03: qty 2

## 2023-08-03 NOTE — ED Provider Notes (Signed)
 [place] EMERGENCY DEPARTMENT AT [facility] Provider Note   CSN: [id number] Arrival date & time: 08/03/23  1659     Patient presents with: Vaginal Bleeding (x5wks)   Catherine Chen is a 23 y.o. female.  Patient with past history significant for type 2 diabetes, and recent IUD placement presents the emergency department with concerns of vaginal bleeding.  She reports that she has been having bleeding fairly consistently for the last 5 weeks since IUD was placed.  She is reach out to her OB/GYN who advised her that this is not unexpected although did send a prescription for TXA to her pharmacy which she has not taken.  She denies any other symptoms at this time but does occasionally feel lightheaded with some of the heavier flow.  No history of any bleeding disorders as far she is aware.   Vaginal Bleeding      Prior to Admission medications   Medication Sig Start Date End Date Taking? Authorizing Provider  albuterol  (PROVENTIL  HFA;VENTOLIN  HFA) 108 (90 Base) MCG/ACT inhaler Inhale 2 puffs into the lungs every 4 (four) hours as needed for wheezing or shortness of breath. 04/21/16   Deis, Jamie, MD  cetirizine (ZYRTEC) 10 MG tablet Take 10 mg by mouth daily.    [provider]  fluticasone (FLONASE) 50 MCG/ACT nasal spray Place 2 sprays into the nose. 04/14/23 04/13/24  [provider]  hydrOXYzine (ATARAX) 25 MG tablet Take 25 mg by mouth as needed for anxiety.    [provider]  Semaglutide -Weight Management (WEGOVY) 0.25 MG/0.5ML SOAJ Inject 0.25 mg into the skin once a week.    [provider]  tranexamic acid  (LYSTEDA ) 650 MG TABS tablet Take 2 tablets (1,300 mg total) by mouth 3 (three) times daily. Take during menses for a maximum of five days 08/03/23   Jordana Dugue A, PA-C    Allergies: Food, Other, Peanut-containing drug products, Peanuts [peanut oil], Penicillin g, Penicillins, Shellfish allergy, Coconut (cocos nucifera), and Amoxicillin     Review of Systems  Genitourinary:  Positive for vaginal bleeding.  All other systems reviewed and are negative.   Updated Vital Signs BP 132/75   Pulse 64   Temp 98.4 F (36.9 C) (Oral)   Resp 16   LMP 03/14/2023 (Approximate)   SpO2 99%   Physical Exam Vitals and nursing note reviewed.  Constitutional:      General: She is not in acute distress.    Appearance: She is well-developed.  HENT:     Head: Normocephalic and atraumatic.  Eyes:     Conjunctiva/sclera: Conjunctivae normal.  Cardiovascular:     Rate and Rhythm: Normal rate and regular rhythm.     Heart sounds: No murmur heard. Pulmonary:     Effort: Pulmonary effort is normal. No respiratory distress.     Breath sounds: Normal breath sounds.  Abdominal:     Palpations: Abdomen is soft.     Tenderness: There is no abdominal tenderness.  Musculoskeletal:        General: No swelling.     Cervical back: Neck supple.  Skin:    General: Skin is warm and dry.     Capillary Refill: Capillary refill takes less than 2 seconds.  Neurological:     Mental Status: She is alert.  Psychiatric:        Mood and Affect: Mood normal.     (all labs ordered are listed, but only abnormal results are displayed) Labs Reviewed  PREGNANCY, URINE  CBC  EKG: None  Radiology: No results found.   Procedures   Medications Ordered in the ED  acetaminophen  (TYLENOL ) tablet 1,000 mg (1,000 mg Oral Given 08/03/23 2131)                                    Medical Decision Making Amount and/or Complexity of Data Reviewed Labs: ordered.  Risk OTC drugs. Prescription drug management.   This patient presents to the ED for concern of vaginal bleeding.  Differential diagnosis includes complications of IUD, anemia, abnormal uterine bleeding, dysmenorrhea   Lab Tests:  I Ordered, and personally interpreted labs.  The pertinent results include: Urine pregnancy negative, hemoglobin stable on CBC of 12.7   Problem List  / ED Course:  Patient with history of recent IUD placement about 5 weeks ago presents to the emergency department with concerns of vaginal bleeding.  She reports that since she had her IUD placed, has had intermittent episodes of heavy vaginal bleeding.  She reports that this feels like she is on her period this entire time.  Has occasional had some feelings of dizziness and lightheadedness from the blood loss.  Reports that she is going through 10 pads per day.  Denies any abdominal pain or tenderness at this time. On exam, patient is well-appearing but somewhat uncomfortable.  No focal abdominal tenderness. Chart review, appears that patient had TXA prescribed by her OB/GYN for use 3 times daily which she has not been taking.  Advised patient that the IUD is likely the cause of recurrent bleeding and to potentially benefit from removal but this does not certainly be done emergently in the emergency department. Do not feel that patient requires emergent imaging at this time given onset of symptoms coincides exactly with IUD placement. Encourage close follow-up with her OB/GYN and then resending her TXA medication to the pharmacy that she is currently requesting and using.  Reports return precautions such as concerns for new or worsening symptoms.  Otherwise stable for outpatient follow-up and discharged home.  Final diagnoses:  Abnormal uterine bleeding  IUD check up    ED Discharge Orders          Ordered    tranexamic acid  (LYSTEDA ) 650 MG TABS tablet  3 times daily        08/03/23 2127               Semira Stoltzfus A, PA-C 08/03/23 2353    Dean Clarity, MD 08/04/23 1549

## 2023-08-03 NOTE — Discharge Instructions (Addendum)
 You were seen in the ER today for concerns of vaginal bleeding. This appears to be likely due to your IUD that was placed before this bleeding started. Your hemoglobin levels are thankfully remaining stable but I would take the medications that had been prescribed to you by your OBGYN. I have resent this medication. Please reach out to your OB office for evaluation and discussion on next steps if your bleeding is not improving as this could mean that you would need to have the IUD removed.

## 2023-08-03 NOTE — ED Triage Notes (Signed)
 Pt c/o vaginal bleeding x5wks, bleeding through 10 pads/day, associated dizziness, HA, abd pressure in lower belly. Syncopal episode yesterday  LMP approx 5wks ago, had an IUD placed & was advised being on my period wouldn't affect it- period hasn't stopped.

## 2023-08-03 NOTE — Progress Notes (Signed)
 Subjective   Catherine Chen is a 23 y.o. (DOB 2000-03-06) female.     Patient presents with  . Vaginal Bleeding    For 5 weeks; pt states that she had an IUD placement on 07/06/2023 and has been bleeding since; pt states that she goes through about 10 pads daily      History of Present Illness This is a 23 year old female with a history of IUD insertion presenting with heavy menstrual bleeding.  She has been experiencing heavy menstrual bleeding for the past 5 weeks, which started on the same day as her IUD insertion on 06/27/2023. She reports using approximately 10 pads per day. She was informed that this was a normal side effect of the IUD and was scheduled for a follow-up appointment on 08/31/2023. Yesterday, she experienced a fainting episode, during which she lost both vision and hearing. She does not recall hitting her head during the fall. She did not seek immediate medical attention as it was late and she attributed the incident to fatigue. She drove herself to the clinic today but reports seeing floaters. She is not currently feeling faint but mentions that she felt similar symptoms while at work. Additionally, she reports pain in the suprapubic area.   Reviewed and updated this visit by provider: Tobacco  Allergies  Meds  Problems  Med Hx  Surg Hx  Fam Hx           Past Medical History:  Diagnosis Date  . Asthma (*)   . Strep throat     Allergies[1]   Past Surgical History:  Procedure Laterality Date  . Tonsillectomy      Family History  Problem Relation Age of Onset  . Depression Mother   . Hypertension Mother     Social History[2]   Review of Systems Review of Systems - All systems reviewed and are negative except what is noted in the HPI.   Objective   Vitals:   08/03/23 1615  BP: 142/85  Patient Position: Sitting  Pulse: 93  Temp: 98.3 F (36.8 C)  Resp: 20  Height: 5' 9 (1.753 m)  Weight: 252 lb 9.6 oz (114.6 kg)  SpO2: 98%  BMI  (Calculated): 37.3   Physical Exam Physical Exam General Appearance: Alert and oriented, appears not in any distress. Vital signs: Within normal limits. HEENT-mucosa pale Respiratory: No labored breathing, effort normal Cardiovascular: Regular rate and rhythm. No murmur, click, or rub. Gastrointestinal: Abdominal sounds active. Tenderness over suprapubic area. Skin: Warm and dry, no rash. Neurological: Alert and oriented.   Results   No results found for this or any previous visit.  No results found.   Impression/Plan   Assessment & Plan 1. Menorrhagia: Acute. Heavy bleeding for 5 weeks following IUD insertion, saturating approximately 10 pads/day. Syncopal episode with loss of vision and hearing. - Recommend ER for further evaluation, EMS offered, patient declined.  Friend will take her to closest ER.  Vital signs stable at time of discharge. - Hemoglobin check  2. Suprapubic tenderness: Acute. - Recommend ER for further evaluation  Follow-up - ER visit for further evaluation  1. Menorrhagia with irregular cycle (Primary)     Risks, benefits, and alternatives of the medications and treatment plan prescribed today were discussed, and patient expressed understanding. Plan follow-up as discussed or as needed if any worsening symptoms or change in condition.       Attestation: Designer, fashion/clothing was used to create visit note. Consent from the patient/caregiver was obtained  prior to its use.        [1] No Known Allergies [2] Social History Socioeconomic History  . Marital status: Single  Tobacco Use  . Smoking status: Unknown  Substance and Sexual Activity  . Alcohol use: No  . Drug use: No

## 2023-08-10 NOTE — BH Specialist Note (Unsigned)
 Integrated Behavioral Health via Telemedicine Visit  08/11/2023 Catherine Chen [id number]  Number of Integrated Behavioral Health Clinician visits: 2- Second Visit  Session Start time: 0815   Session End time: 0835  Total time in minutes: 20  Referring Provider: Carter Quarry, MD Patient/Family location: [facility] Provider location: Center for [facility] at [facility]  All persons participating in visit: [name] and [facility] Catherine Chen   Types of Service: Individual psychotherapy and Video visit  I connected with Catherine Chen and/or Catherine Chen n/a via  Telephone or Video Enabled Telemedicine Application  (Video is Caregility application) and verified that I am speaking with the correct person using two identifiers. Discussed confidentiality: Yes   I discussed the limitations of telemedicine and the availability of in person appointments.  Discussed there is a possibility of technology failure and discussed alternative modes of communication if that failure occurs.  I discussed that engaging in this telemedicine visit, they consent to the provision of behavioral healthcare and the services will be billed under their insurance.  Patient and/or legal guardian expressed understanding and consented to Telemedicine visit: Yes   Presenting Concerns: Patient and/or family reports the following symptoms/concerns: Continued depression and anxiety; heavy bleeding for a solid six weeks with dizziness; has taken hydroxyzine a few times only with panic; prefers ongoing therapy with no psychiatry; self-coping strategies mildly helpful.  Duration of problem: Ongoing; Severity of problem: severe  Patient and/or Family's Strengths/Protective Factors: Concrete supports in place (healthy food, safe environments, etc.) and Sense of purpose  Goals Addressed: Patient will:  Reduce symptoms of: anxiety, depression, insomnia, and stress     Demonstrate ability to: Increase motivation to adhere to plan of care  Progress towards Goals: Ongoing    Interventions: Interventions utilized:  Solution-Focused Strategies and Supportive Reflection Standardized Assessments completed: GAD-7 and PHQ 9    Patient and/or Family Response: Patient agrees with treatment plan.    Clinical Assessment/Diagnosis  Mood disorder (HCC) .   Patient may benefit from continued therapeutic intervention  .  Plan: Follow up with behavioral health clinician on : Two weeks Behavioral recommendations:  -Continue taking hydroxyzine as prescribed -Continue plan to see ob/gyn today in-office for medical concern -Establish with ongoing therapist of choice (some options on After Visit Summary) -Consider [facility] Care 24/7 as needed and discussed Referral(s): Integrated [employer] (In Clinic)  I discussed the assessment and treatment plan with the patient and/or parent/guardian. They were provided an opportunity to ask questions and all were answered. They agreed with the plan and demonstrated an understanding of the instructions.   They were advised to call back or seek an in-person evaluation if the symptoms worsen or if the condition fails to improve as anticipated.  Warren BROCKS Catherine Sweatman, LCSW     08/11/2023    8:30 AM 07/06/2023    4:45 PM 05/26/2023   12:01 PM 05/25/2023    3:56 PM 02/29/2020   10:23 AM  Depression screen PHQ 2/9  Decreased Interest 2 3 2 2 1   Down, Depressed, Hopeless 2 2 2 2 1   PHQ - 2 Score 4 5 4 4 2   Altered sleeping 3 2 2 2  0  Tired, decreased energy 3 2 2 2 1   Change in appetite 3 3 2 2 3   Feeling bad or failure about yourself  2 2 1 1 2   Trouble concentrating  1 0 0 1  Moving slowly or fidgety/restless 0 0 0 0 0  Suicidal thoughts 1 1 1 1  0  PHQ-9 Score 16 16 12 12 9   Difficult doing work/chores     Somewhat difficult   .gd7

## 2023-08-11 ENCOUNTER — Ambulatory Visit (INDEPENDENT_AMBULATORY_CARE_PROVIDER_SITE_OTHER): Admitting: Clinical

## 2023-08-11 ENCOUNTER — Ambulatory Visit: Admitting: Advanced Practice Midwife

## 2023-08-11 ENCOUNTER — Encounter: Payer: Self-pay | Admitting: Advanced Practice Midwife

## 2023-08-11 ENCOUNTER — Other Ambulatory Visit: Payer: Self-pay

## 2023-08-11 VITALS — BP 135/86 | HR 73 | Wt 353.2 lb

## 2023-08-11 DIAGNOSIS — N921 Excessive and frequent menstruation with irregular cycle: Secondary | ICD-10-CM | POA: Diagnosis not present

## 2023-08-11 DIAGNOSIS — F39 Unspecified mood [affective] disorder: Secondary | ICD-10-CM

## 2023-08-11 DIAGNOSIS — Z975 Presence of (intrauterine) contraceptive device: Secondary | ICD-10-CM | POA: Diagnosis not present

## 2023-08-11 DIAGNOSIS — N939 Abnormal uterine and vaginal bleeding, unspecified: Secondary | ICD-10-CM

## 2023-08-11 NOTE — Patient Instructions (Signed)
 Center for [facility] at [facility] [address] [place], [place] [address] [phone] (main office) [phone] [facility] office)  [facility]  [address] [place], [place] [address] [phone] or [phone] [facility] 24/7 FOR ANYONE [address], [place], [place]  [phone] Fax: [phone] [email or web address] *Interpreters available *Accepts all insurance and uninsured for Urgent Care needs *Accepts Medicaid and uninsured for outpatient treatment (below)    ONLY FOR [facility]  Below:   Outpatient New Patient Assessment/Therapy Walk-ins:        Monday -Thursday 8am until slots are full.        Every Friday 1pm-4pm  (first come, first served)                   New Patient Psychiatry/Medication Management        Monday-Friday 8am-11am (first come, first served)              For all walk-ins we ask that you arrive by 7:15am, because patients will be seen in the order of arrival.    Behavioral Health Resources:   What if I or someone I know is in crisis?  If you are thinking about harming yourself or having thoughts of suicide, or if you know someone who is, seek help right away.  Call your doctor or mental health care provider.  Call 911 or go to a hospital emergency room to get immediate help, or ask a friend or family member to help you do these things; IF YOU ARE IN [place], YOU MAY GO TO WALK-IN URGENT CARE 24/7 at [facility] (see below)  Call the [place]  National Suicide Prevention Lifeline's toll-free, 24-hour hotline at 1-800-273-TALK (629) 777-4560) or TTY: 1-800-799-4 TTY 431-650-0963) to talk to a trained counselor.  If you are in crisis, make sure you are not left alone.   If someone else is in crisis, make sure he or she is not left alone   24 Hour :   [facility]  [address] [place], [place] [address]  [phone] or [phone] WALK-IN URGENT CARE 24/7  Therapeutic [facility] Mobile Crisis: [phone]  [place]  National Suicide Hotline: 803-750-9308  [facility] of the [employer] (Domestic Violence, Rape & Victim Assistance)  (908)745-1522  [facility]  [address] [place], [place]  [address]   [phone] or [phone]   [facility] [employer] Crisis Services: [phone] (8am-4pm) or [phone] (after hours)       Psychology Today [email or web address]   [facility], [address] [place], [place]  [phone] Fax: [phone] [email or web address] *Interpreters available *Accepts all insurance and uninsured for Urgent Care needs *Accepts Medicaid and uninsured for outpatient treatment   [facility]   Mon-Fri: 8am-5pm [address], [name], [place] [phone](eynwz); [phone](fax) [email or web address]  *Accepts Medicare  [facility] [place] Earlean Ever[place], Fri: 8am-4pm [address], [place], [place] [address] [phone] (phone); [phone] (fax) [email or web address]  *Accepts Medicare  [facility] Mon-Fri: 9am-5pm  [address] [place], [place] [phone] (phone); [phone]  [email or web address]  *Accepts Medicaid  [facility] [place], 8:30am-12pm/1pm-2:30pm [address], [place], [place] [phone] (phone); [phone] (fax) [email or web address].[email or web address]  *Accepts Medicaid, sliding-scale*Bilingual services available  [facility] Mon-Fri, 8am-7pm [address] [place], [place]  [phone](eynwz); [phone](fax) www.[email or web address]  *Accepts Medicaid *Bilingual services available  [facility] Mon-Fri: 8am-5pm, Saturday by  appointment only [address] [place],  [place], [place] [phone] (phone); [phone] (fax) [email or web address]   [facility] [address], [place], [place] [phone] [email or web address]  *Free & reduced services for uninsured and underinsured individuals *Bilingual services for Spanish-speaking clients 21 and under  [facility] Mon-Fri: 9am-5:30pm [address], [place], [place] [phone] (phone), [phone] (fax) After hours crisis line: [phone] [email or web address]  *Accepts Medicare and Medicaid  [employer], 8am-6pm [address] [place], [place]  [phone] (phone); [phone] (fax) [email or web address]  *Subsidized costs available  [facility] Mon-Fri: 8am-4pm [address] [place], [place] [phone](eynwz); [phone](fax) [email or web address]  *Accepts Medicaid  [facility] Same day access hours: Mon-Fri, 8:30-3pm Crisis hours: Mon-Fri, 8am-5pm [address] [place], [place] [phone]  [facility] [employer] Same day access hours: Mon-Fri, 8:30-3pm Crisis hours: Mon-Fri, 8am-8pm [address], [place], [place] [phone] (phone); [phone] (fax) www.[email or web address]  *Accepts Medicaid and Medicare  [facility] [place], [place], Fri: 9am-9pm Tues, Thurs: 9am-6pm [address] [place], [place], [place]  [phone] (phone); [phone] (fax) [email or web address]  *(Accepts Medicare and Medicaid; payment plans available)*Bilingual services available  [facility] [address] [place], [place] [phone] (phone); [phone] (fax) [email or web address]   [facility] [address], [place], [place]  [phone]  [email or web address]  *Bilingual services available  [facility]) Mon-Thurs:  8am-8pm [address], [place], [place] [phone] (phone); [phone] (fax) [email or web address]  *Accepts Medicaid*Bilingual services available  [facility] [address] [place], [place] [phone] (phone) [email or web address]  *Accepts Medicaid *Bilingual services available  [facility] Mon-Fri, 9am-4:45pm [address], [place], [place] [phone] (phone); [phone] (fax) [email or web address]  *Accepts Medicare  [facility] Mon-Thurs: 8:30-8pm, Fri: 8:30am-7pm [address] [place], [place] (3rd floor) [phone] (phone); [phone] (fax) [email or web address]  *Accepts Medicaid; income-based reduced rates available  [employer] [facility] Mon-Fri: 8am-5pm [address], [place], [place] [address] [phone] (phone); [phone] (fax) [email or web address]  *Accepts Medicaid*Bilingual services available

## 2023-08-11 NOTE — Progress Notes (Signed)
 GYNECOLOGY OFFICE VISIT NOTE  History:   Catherine Chen is a 23 y.o. G0P0000 here today for prolonged bleeding since having Skyla  IUD placed six weeks ago. Reports mild cramping. Pt has Hx AUB and PCOS. Was treated in the past with OPCs but stopped due to weight loss reasons. Currently on Wegovy. Reports taking TXA for the past 7 days with no improvement.   07/23/23: Contacted our office RE: prolonged and heavy bleeding. Rx TXA. Dr. Jeralyn recommended appt to check IUD placement.  08/03/23: Was see at [facility] ED for prolonged vaginal bleeding and syncopal episode. Hgb 12.7. Advised to F/U w/ Gyn. Reported not having started TXA at that time.   Last Pelvic US  visible on chart review 2020 was Nml. Pt doesn't think she's had one more recently.   Past Medical History:  Diagnosis Date   Allergy    Phreesia 09/13/2019   Anxiety    Asthma    Bone fracture 12/2022   Depression    Fever 12/06/2010   Headache(784.0)    Hydradenitis 04/22/2016   Hypertriglyceridemia    Multiple food allergies    shell fish, nuts, watermellon   Obesity    PCOS (polycystic ovarian syndrome)    Pharyngitis 12/06/2010   Postconcussion syndrome 02/19/2016    Past Surgical History:  Procedure Laterality Date   TONSILLECTOMY AND ADENOIDECTOMY  2008 or 2009    The following portions of the patient's history were reviewed and updated as appropriate: allergies, current medications, past family history, past medical history, past social history, past surgical history and problem list.    Review of Systems:  Pertinent items noted in HPI and remainder of comprehensive ROS otherwise negative.  Physical Exam:  BP 135/86   Pulse 73   Wt (!) 353 lb 3.2 oz (160.2 kg)   BMI 52.16 kg/m  CONSTITUTIONAL: Well-developed, well-nourished female in no acute distress.  HEENT:  Normocephalic, atraumatic. No scleral icterus.  SKIN: No rash noted. Not diaphoretic. No erythema. No pallor. MUSCULOSKELETAL: Normal range of  motion. No edema noted. NEUROLOGIC: Alert and oriented to person, place, and time. Normal muscle tone coordination. No cranial nerve deficit noted on observation. PSYCHIATRIC: Normal mood and affect. Normal behavior. Normal judgment and thought content. CARDIOVASCULAR: Normal heart rate noted RESPIRATORY: Effort and breath sounds normal, no problems with respiration noted ABDOMEN: Deferred  PELVIC: Normal appearing external genitalia; normal urethral meatus; normal appearing vaginal mucosa and cervix.  No abnormal discharge noted. Small amount of dark red vaginal bleeding. IUD string visible, Nml length. No IUD protruding through os. Pt reported discomfort with speculum exam.  Performed in the presence of a chaperone  Labs  CBC    Component Value Date/Time   WBC 10.3 08/03/2023 1711   RBC 4.68 08/03/2023 1711   HGB 12.7 08/03/2023 1711   HCT 39.8 08/03/2023 1711   PLT 283 08/03/2023 1711   MCV 85.0 08/03/2023 1711   MCH 27.1 08/03/2023 1711   MCHC 31.9 08/03/2023 1711   RDW 12.5 08/03/2023 1711   LYMPHSABS 2,044 11/15/2020 1045   MONOABS 0.5 09/28/2017 0020   EOSABS 190 11/15/2020 1045   BASOSABS 30 11/15/2020 1045    Assessment and Plan:      1. Abnormal uterine bleeding (AUB) (Primary)- Likely due to IUD but may be related to and in combination with pt's prior Hx AUB and PCOC. Hemodynamically stable. Small amount of active bleeding on exam. Offered CBC. Declined since she had it check in the ED recently.  - Discussed  options to immediately address prolonged bleeding include removal of IUD (although cessation of bleeding is not certain) or OCP course w/ possible taper. Pt declined IUD removal today. Declined OCPs due to being on a GLP-1. CNM was not aware of a contraindication for use for breakthrough bleeding, only that effectiveness of OCP for contraception may be reduced on GLP-1's. Consulted with Dr. Jeralyn who agreed. Also suggested option contraceptive patch for AUB. Pt had  left by the time CNM returned to room. MyChart message sent with Dr. Ritchie suggestions. Will await pt's response. Of note, pt has had elevated BPs of some other encounters. I do not think this is a contraindication to a short course of OCPs or patches.  - US  PELVIC COMPLETE WITH TRANSVAGINAL; Future - Will schedule F/U appt after US . Recommend IUD removal if it is found to be malpositioned. Also suggested considering Mirena  or Liletta  in place of Skyla  since they are specifically indicated for AUB.   2. Breakthrough bleeding associated with intrauterine device (IUD) - US  PELVIC COMPLETE WITH TRANSVAGINAL; Future  Routine preventative health maintenance measures emphasized. Please refer to After Visit Summary for other counseling recommendations.   Future Appointments  Date Time Provider Department Center  08/21/2023 10:00 AM [facility]  1 [facility]  [facility]    I spent 20 minutes dedicated to the care of this patient including pre-visit review of records, face to face time with the patient discussing her conditions and treatments, post visit ordering of medications and appropriate tests or procedures, coordinating care and documenting this visit encounter.    Jackline Castilla  Claudene HOWARD 08/11/2023 Center for [employer], [facility]

## 2023-08-21 ENCOUNTER — Ambulatory Visit (HOSPITAL_COMMUNITY)

## 2023-08-25 ENCOUNTER — Encounter

## 2023-08-31 ENCOUNTER — Ambulatory Visit: Admitting: Obstetrics and Gynecology

## 2023-09-22 ENCOUNTER — Ambulatory Visit: Admitting: Obstetrics and Gynecology
